# Patient Record
Sex: Male | Born: 1990 | Race: White | Hispanic: No | Marital: Single | Smoking: Never smoker
Health system: Southern US, Community
[De-identification: ages and names within clinical notes are randomized; demographics above are authoritative.]

---

## 2012-07-09 DIAGNOSIS — F411 Generalized anxiety disorder: Secondary | ICD-10-CM | POA: Insufficient documentation

## 2012-07-09 DIAGNOSIS — F909 Attention-deficit hyperactivity disorder, unspecified type: Secondary | ICD-10-CM | POA: Insufficient documentation

## 2017-07-31 ENCOUNTER — Encounter: Payer: Self-pay | Admitting: Family Medicine

## 2017-07-31 ENCOUNTER — Ambulatory Visit: Payer: Managed Care, Other (non HMO) | Admitting: Family Medicine

## 2017-07-31 DIAGNOSIS — S63642A Sprain of metacarpophalangeal joint of left thumb, initial encounter: Secondary | ICD-10-CM | POA: Diagnosis not present

## 2017-07-31 NOTE — Patient Instructions (Signed)
You have skier's thumb (injury to the UCL of the thumb). It's critical you wear the brace at all times. Tylenol and/or ibuprofen as needed for pain. Follow up with me in about 1 1/2 weeks when you're 2 weeks out. We will repeat your x-rays at that time and again discuss conservative versus surgical management further. Most of these heal well without surgery as long as the joint isn't out of place due to a tendon (your x-rays do not indicate you have this). Expect 6-8 weeks for this to heal.

## 2017-08-04 ENCOUNTER — Encounter: Payer: Self-pay | Admitting: Family Medicine

## 2017-08-04 DIAGNOSIS — S63642A Sprain of metacarpophalangeal joint of left thumb, initial encounter: Secondary | ICD-10-CM | POA: Insufficient documentation

## 2017-08-04 NOTE — Assessment & Plan Note (Signed)
Unfortunately unable to pull up radiographs on CD though was able to review on his phone - he has a very small avulsion but no subluxation of 1st MCP joint, consistent with Grade 3 skiers thumb.  We discussed both conservative and operative treatment of this condition.  Will go ahead with conservative treatment without evidence Stener lesion but discussed if not improving over 6-8 weeks or has evidence of this lesion will refer for reconstruction.  Stressed importance of wearing brace at all times.  Tylenol and/or ibuprofen.  F/u in 1 1/2 weeks.

## 2017-08-04 NOTE — Progress Notes (Signed)
PCP: Patient, No Pcp Per  Subjective:   HPI: Patient is a 26 y.o. male here for left thumb injury.  Patient reports on 11/15 he accidentally jammed his left thumb into the counter. Immediate pain ulnar side of left thumb. He is right handed. Wearing thumb spica brace, took ibuprofen a couple times. Pain level 4/10 and throbbing. Associated swelling. No other skin changes, numbness.  History reviewed. No pertinent past medical history.  Current Outpatient Medications on File Prior to Visit  Medication Sig Dispense Refill  . dicyclomine (BENTYL) 10 MG capsule Take 10 mg by mouth.    . hydrOXYzine (ATARAX/VISTARIL) 10 MG tablet Take 10 mg by mouth.    . methylphenidate (RITALIN) 10 MG tablet Take 10 mg by mouth.    Marland Kitchen. omeprazole (PRILOSEC) 20 MG capsule Take by mouth.     No current facility-administered medications on file prior to visit.     History reviewed. No pertinent surgical history.  No Known Allergies  Social History   Socioeconomic History  . Marital status: Single    Spouse name: Not on file  . Number of children: Not on file  . Years of education: Not on file  . Highest education level: Not on file  Social Needs  . Financial resource strain: Not on file  . Food insecurity - worry: Not on file  . Food insecurity - inability: Not on file  . Transportation needs - medical: Not on file  . Transportation needs - non-medical: Not on file  Occupational History  . Not on file  Tobacco Use  . Smoking status: Never Smoker  . Smokeless tobacco: Never Used  Substance and Sexual Activity  . Alcohol use: Not on file  . Drug use: Not on file  . Sexual activity: Not on file  Other Topics Concern  . Not on file  Social History Narrative  . Not on file    History reviewed. No pertinent family history.  BP 128/84   Pulse 76   Ht 6\' 1"  (1.854 m)   Wt 166 lb (75.3 kg)   BMI 21.90 kg/m   Review of Systems: See HPI above.     Objective:  Physical  Exam:  Gen: NAD, comfortable in exam room  Left hand/wrist: Mild swelling into thenar eminence.  No bruising, other deformity. TTP UCL 1st MCP joint.  No other tenderness of wrist or hand. FROM digits and wrist with 5/5 strength. No endpoint on testing 1st MCP UCL.  Other collateral ligaments intact. NVI distally.  Right hand/wrist: FROM without pain.   Assessment & Plan:  1. Left skiers thumb - Unfortunately unable to pull up radiographs on CD though was able to review on his phone - he has a very small avulsion but no subluxation of 1st MCP joint, consistent with Grade 3 skiers thumb.  We discussed both conservative and operative treatment of this condition.  Will go ahead with conservative treatment without evidence Stener lesion but discussed if not improving over 6-8 weeks or has evidence of this lesion will refer for reconstruction.  Stressed importance of wearing brace at all times.  Tylenol and/or ibuprofen.  F/u in 1 1/2 weeks.

## 2017-08-21 ENCOUNTER — Ambulatory Visit: Payer: Managed Care, Other (non HMO) | Admitting: Family Medicine

## 2017-08-21 ENCOUNTER — Encounter: Payer: Self-pay | Admitting: Family Medicine

## 2017-08-21 ENCOUNTER — Ambulatory Visit (HOSPITAL_BASED_OUTPATIENT_CLINIC_OR_DEPARTMENT_OTHER)
Admission: RE | Admit: 2017-08-21 | Discharge: 2017-08-21 | Disposition: A | Discharge status: Home / Self Care | Payer: Managed Care, Other (non HMO) | Source: Ambulatory Visit | Attending: Family Medicine | Admitting: Family Medicine

## 2017-08-21 VITALS — BP 125/79 | HR 80 | Ht 73.0 in | Wt 166.0 lb

## 2017-08-21 DIAGNOSIS — S63642A Sprain of metacarpophalangeal joint of left thumb, initial encounter: Secondary | ICD-10-CM

## 2017-08-21 DIAGNOSIS — S63642D Sprain of metacarpophalangeal joint of left thumb, subsequent encounter: Secondary | ICD-10-CM

## 2017-08-21 DIAGNOSIS — S5332XD Traumatic rupture of left ulnar collateral ligament, subsequent encounter: Secondary | ICD-10-CM | POA: Insufficient documentation

## 2017-08-21 DIAGNOSIS — X58XXXD Exposure to other specified factors, subsequent encounter: Secondary | ICD-10-CM | POA: Insufficient documentation

## 2017-08-21 NOTE — Patient Instructions (Signed)
Wear the brace for 2 more weeks as you have been. After this, wear only if needed and start occupational therapy at [facility]. Tylenol, ibuprofen only if needed. Follow up with me in 1 month for reevaluation.

## 2017-08-22 NOTE — Assessment & Plan Note (Signed)
Improving clinically.  Thumb spica brace for 2 more weeks for total of 6 weeks immobilization then plan to start occupational therapy.  Tylenol, ibuprofen as needed.  F/u in 1 month.

## 2017-08-22 NOTE — Progress Notes (Signed)
PCP: Patient, No Pcp Per  Subjective:   HPI: Patient is a 26 y.o. male here for left thumb injury.  11/21: Patient reports on 11/15 he accidentally jammed his left thumb into the counter. Immediate pain ulnar side of left thumb. He is right handed. Wearing thumb spica brace, took ibuprofen a couple times. Pain level 4/10 and throbbing. Associated swelling. No other skin changes, numbness.  12/12: Patient reports he's doing well. A little discomfort but improved. Has been wearing thumb spica brace regularly. No skin changes, numbness.  History reviewed. No pertinent past medical history.  Current Outpatient Medications on File Prior to Visit  Medication Sig Dispense Refill  . dicyclomine (BENTYL) 10 MG capsule Take 10 mg by mouth.    . hydrOXYzine (ATARAX/VISTARIL) 10 MG tablet Take 10 mg by mouth.    . methylphenidate (RITALIN) 10 MG tablet Take 10 mg by mouth.    Marland Kitchen. omeprazole (PRILOSEC) 20 MG capsule Take by mouth.     No current facility-administered medications on file prior to visit.     History reviewed. No pertinent surgical history.  No Known Allergies  Social History   Socioeconomic History  . Marital status: Single    Spouse name: Not on file  . Number of children: Not on file  . Years of education: Not on file  . Highest education level: Not on file  Social Needs  . Financial resource strain: Not on file  . Food insecurity - worry: Not on file  . Food insecurity - inability: Not on file  . Transportation needs - medical: Not on file  . Transportation needs - non-medical: Not on file  Occupational History  . Not on file  Tobacco Use  . Smoking status: Never Smoker  . Smokeless tobacco: Never Used  Substance and Sexual Activity  . Alcohol use: Not on file  . Drug use: Not on file  . Sexual activity: Not on file  Other Topics Concern  . Not on file  Social History Narrative  . Not on file    History reviewed. No pertinent family history.  BP  125/79   Pulse 80   Ht 6\' 1"  (1.854 m)   Wt 166 lb (75.3 kg)   BMI 21.90 kg/m   Review of Systems: See HPI above.     Objective:  Physical Exam:  Gen: NAD, comfortable in exam room.  Left hand/wrist: No swelling, bruising, other deformity. Minimal TTP UCL 1st MCP joint.  No other tenderness of wrist or hand. FROM digits and wrist with 5/5 strength but 4/5 with grip strength. Endpoint noted now 1st MCP UCL with laxity.  RCL intact. NVI distally.   Assessment & Plan:  1. Left skiers thumb - Improving clinically.  Thumb spica brace for 2 more weeks for total of 6 weeks immobilization then plan to start occupational therapy.  Tylenol, ibuprofen as needed.  F/u in 1 month.

## 2018-05-05 ENCOUNTER — Other Ambulatory Visit: Payer: Self-pay

## 2018-05-05 ENCOUNTER — Encounter (HOSPITAL_BASED_OUTPATIENT_CLINIC_OR_DEPARTMENT_OTHER): Payer: Self-pay | Admitting: *Deleted

## 2018-05-05 ENCOUNTER — Emergency Department (HOSPITAL_BASED_OUTPATIENT_CLINIC_OR_DEPARTMENT_OTHER)
Admission: EM | Admit: 2018-05-05 | Discharge: 2018-05-05 | Disposition: A | Discharge status: Home / Self Care | Payer: Managed Care, Other (non HMO) | Attending: Emergency Medicine | Admitting: Emergency Medicine

## 2018-05-05 ENCOUNTER — Emergency Department (HOSPITAL_BASED_OUTPATIENT_CLINIC_OR_DEPARTMENT_OTHER): Payer: Managed Care, Other (non HMO)

## 2018-05-05 DIAGNOSIS — Z79899 Other long term (current) drug therapy: Secondary | ICD-10-CM | POA: Diagnosis not present

## 2018-05-05 DIAGNOSIS — K529 Noninfective gastroenteritis and colitis, unspecified: Secondary | ICD-10-CM

## 2018-05-05 DIAGNOSIS — R1031 Right lower quadrant pain: Secondary | ICD-10-CM | POA: Diagnosis present

## 2018-05-05 LAB — COMPREHENSIVE METABOLIC PANEL
ALT: 13 U/L (ref 0–44)
ANION GAP: 11 (ref 5–15)
AST: 17 U/L (ref 15–41)
Albumin: 4.2 g/dL (ref 3.5–5.0)
Alkaline Phosphatase: 42 U/L (ref 38–126)
BUN: 15 mg/dL (ref 6–20)
CALCIUM: 8.8 mg/dL — AB (ref 8.9–10.3)
CO2: 26 mmol/L (ref 22–32)
Chloride: 97 mmol/L — ABNORMAL LOW (ref 98–111)
Creatinine, Ser: 1.09 mg/dL (ref 0.61–1.24)
Glucose, Bld: 94 mg/dL (ref 70–99)
Potassium: 4.2 mmol/L (ref 3.5–5.1)
Sodium: 134 mmol/L — ABNORMAL LOW (ref 135–145)
Total Bilirubin: 0.6 mg/dL (ref 0.3–1.2)
Total Protein: 7.3 g/dL (ref 6.5–8.1)

## 2018-05-05 LAB — CBC
HCT: 43.5 % (ref 39.0–52.0)
HEMOGLOBIN: 15.6 g/dL (ref 13.0–17.0)
MCH: 31 pg (ref 26.0–34.0)
MCHC: 35.9 g/dL (ref 30.0–36.0)
MCV: 86.3 fL (ref 78.0–100.0)
PLATELETS: 148 10*3/uL — AB (ref 150–400)
RBC: 5.04 MIL/uL (ref 4.22–5.81)
RDW: 12.1 % (ref 11.5–15.5)
WBC: 4.1 10*3/uL (ref 4.0–10.5)

## 2018-05-05 LAB — URINALYSIS, ROUTINE W REFLEX MICROSCOPIC
Bilirubin Urine: NEGATIVE
Glucose, UA: NEGATIVE mg/dL
HGB URINE DIPSTICK: NEGATIVE
Ketones, ur: NEGATIVE mg/dL
LEUKOCYTES UA: NEGATIVE
Nitrite: NEGATIVE
PROTEIN: NEGATIVE mg/dL
SPECIFIC GRAVITY, URINE: 1.015 (ref 1.005–1.030)
pH: 6 (ref 5.0–8.0)

## 2018-05-05 LAB — LIPASE, BLOOD: LIPASE: 22 U/L (ref 11–51)

## 2018-05-05 MED ORDER — IOPAMIDOL (ISOVUE-300) INJECTION 61%
100.0000 mL | Freq: Once | INTRAVENOUS | Status: AC | PRN
  Administered 2018-05-05: 100 mL via INTRAVENOUS

## 2018-05-05 MED ORDER — ONDANSETRON 8 MG PO TBDP: 8.0000 mg | ORAL_TABLET | Freq: Three times a day (TID) | ORAL | 0 refills | Status: AC | PRN

## 2018-05-05 NOTE — ED Triage Notes (Signed)
Diarrhea and vomiting x 2 days. Fever 2 days. Abdominal pain. Tenderness right lower quadrant.

## 2018-05-05 NOTE — Discharge Instructions (Addendum)
Your symptoms are most likely caused by a viral infection.  Continue to drink plenty of fluids, take Zofran as prescribed as needed for nausea and vomiting.  If symptoms persist and do not improve, please follow-up with gastroenterologist.  If however you develop more pain in the right lower quadrant, continued to have fever, and worsening symptoms, return to emergency department for reevaluation for possible appendicitis.

## 2018-05-05 NOTE — ED Notes (Signed)
Oral contrast given to pt at this time by CT tech.

## 2018-05-05 NOTE — ED Provider Notes (Signed)
[facility] EMERGENCY DEPARTMENT Provider Note   CSN: [id number] Arrival date & time: 05/05/18  1130     History   Chief Complaint Chief Complaint  Patient presents with  . Abdominal Pain    HPI Steve Allison is a 27 y.o. male.  HPI Steve Allison is a 27 y.o. male history of IBS and ADD, presents to emergency department complaining of abdominal pain nausea, vomiting, diarrhea.  He states symptoms started 4 days ago.  Initially started with nausea vomiting diarrhea.  He states those all subsided 2 days ago.  However he has had fever persistently for 3 days and now increased pain in his abdomen.  He states his last bowel movement was 2 days ago and was diarrhea.  He has not had a bowel movement since then.  He states pain is mainly in the right lower quadrant.  It is worse when palpating his abdomen.  It is not worse with movement.  He states his fever has been as high as 104 yesterday.  He had a fever of 102 this morning and took ibuprofen prior to coming in.  Patient went to urgent care and was sent here for further evaluation.  Patient denies any prior abdominal surgeries.  He denies any back pain.  No urinary symptoms.  He denies any nausea and vomiting at this time.  History reviewed. No pertinent past medical history.  Patient Active Problem List   Diagnosis Date Noted  . Skier's thumb, left, initial encounter 08/04/2017  . Anxiety state 07/09/2012  . Attention deficit hyperactivity disorder (ADHD) 07/09/2012    History reviewed. No pertinent surgical history.      Home Medications    Prior to Admission medications   Medication Sig Start Date End Date Taking? Authorizing Provider  dicyclomine (BENTYL) 10 MG capsule Take 10 mg by mouth. 03/11/17  Yes [provider]  hydrOXYzine (ATARAX/VISTARIL) 10 MG tablet Take 10 mg by mouth. 07/10/17  Yes [provider]  methylphenidate (RITALIN) 10 MG tablet Take 10 mg by mouth. 07/10/17  Yes [provider]  omeprazole (PRILOSEC) 20 MG capsule Take by mouth. 03/17/17  Yes [provider]    Family History No family history on file.  Social History Social History   Tobacco Use  . Smoking status: Never Smoker  . Smokeless tobacco: Never Used  Substance Use Topics  . Alcohol use: Not on file  . Drug use: Not on file     Allergies   Patient has no known allergies.   Review of Systems Review of Systems  Constitutional: Positive for chills and fever.  Respiratory: Negative for cough, chest tightness and shortness of breath.   Cardiovascular: Negative for chest pain, palpitations and leg swelling.  Gastrointestinal: Positive for abdominal pain, diarrhea, nausea and vomiting. Negative for abdominal distention.  Genitourinary: Negative for dysuria, frequency, hematuria and urgency.  Musculoskeletal: Negative for arthralgias, myalgias, neck pain and neck stiffness.  Skin: Negative for rash.  Allergic/Immunologic: Negative for immunocompromised state.  Neurological: Negative for dizziness, weakness, light-headedness, numbness and headaches.     Physical Exam Updated Vital Signs BP 131/87 (BP Location: Right Arm)   Pulse 70   Temp 98.9 F (37.2 C) (Oral)   Resp 18   Ht 6\' 1"  (1.854 m)   Wt 73 kg   SpO2 100%   BMI 21.24 kg/m   Physical Exam  Constitutional: He appears well-developed and well-nourished. No distress.  HENT:  Head: Normocephalic and atraumatic.  Eyes: Conjunctivae  are normal.  Neck: Neck supple.  Cardiovascular: Normal rate, regular rhythm and normal heart sounds.  Pulmonary/Chest: Effort normal. No respiratory distress. He has no wheezes. He has no rales.  Abdominal: Soft. Bowel sounds are normal. He exhibits no distension. There is tenderness. There is no rebound.  Right lower quadrant tenderness with mild guarding  Musculoskeletal: He exhibits no edema.  Neurological: He is alert.  Skin: Skin is warm and dry.  Nursing note and  vitals reviewed.    ED Treatments / Results  Labs (all labs ordered are listed, but only abnormal results are displayed) Labs Reviewed  COMPREHENSIVE METABOLIC PANEL - Abnormal; Notable for the following components:      Result Value   Sodium 134 (*)    Chloride 97 (*)    Calcium 8.8 (*)    All other components within normal limits  CBC - Abnormal; Notable for the following components:   Platelets 148 (*)    All other components within normal limits  LIPASE, BLOOD  URINALYSIS, ROUTINE W REFLEX MICROSCOPIC    EKG None  Radiology Ct Abdomen Pelvis W Contrast  Result Date: 05/05/2018 CLINICAL DATA:  Lower abdominal pain more tender in RIGHT lower quadrant, fever, vomiting, diarrhea, constipation since Saturday but diarrhea again today with bowel movement, history irritable bowel syndrome, GERD EXAM: CT ABDOMEN AND PELVIS WITH CONTRAST TECHNIQUE: Multidetector CT imaging of the abdomen and pelvis was performed using the standard protocol following bolus administration of intravenous contrast. Sagittal and coronal MPR images reconstructed from axial data set. CONTRAST:  ISOVUE-300 IOPAMIDOL (ISOVUE-300) INJECTION 61% COMPARISON:  None FINDINGS: Lower chest: Lung bases clear Hepatobiliary: Gallbladder and liver normal appearance Pancreas: Pancreas normal appearance Spleen: Normal appearance Adrenals/Urinary Tract: Adrenal glands, kidneys, ureters, and bladder normal appearance Stomach/Bowel: Appendix coiled adjacent to cecal tip. Appendix measures 7 mm diameter without definite surrounding inflammatory changes. Bowel wall thickening of distal ileum compatible with enteritis. Associated hyperemia of the mesentery associated with the thickened loops. Stomach and remaining bowel loops unremarkable. No evidence of bowel obstruction. Vascular/Lymphatic: Numerous normal to upper normal sized mesenteric lymph nodes in mid abdomen and RIGHT mid abdomen. Enlarged 11 mm short axis node medial to  ileocecal valve image 40. Vascular structures unremarkable. Reproductive: Unremarkable Other: No free air or free fluid.  No hernia. Musculoskeletal: Unremarkable IMPRESSION: Bowel wall thickening with associated hyperemia of the mesentery involving terminal ileum consistent with enteritis; differential diagnosis would include infection and inflammatory bowel disease. Numerous predominantly normal to upper normal sized mesenteric lymph nodes though a single minimally enlarged lymph node is seen in the mesentery in the RIGHT mid abdomen. Appendix is minimally prominent size at 7 mm diameter though no definite periappendiceal infiltrative changes are seen; recommend clinical correlation and if patient has persistent or worsening symptoms, consider repeat imaging to exclude appendicitis. Electronically Signed   By: Ulyses Southward M.D.   On: 05/05/2018 17:32    Procedures Procedures (including critical care time)  Medications Ordered in ED Medications - No data to display   Initial Impression / Assessment and Plan / ED Course  I have reviewed the triage vital signs and the nursing notes.  Pertinent labs & imaging results that were available during my care of the patient were reviewed by me and considered in my medical decision making (see chart for details).     Patient in emergency department with fever up to 104 for 3 days, right lower quadrant pain, and tenderness with some guarding on the exam.  Initially had nausea, vomiting, diarrhea which now all improved.  He is afebrile here, normal vital signs.  Labs obtained, pending.  Will get CT abdomen pelvis to rule out appendicitis.]  6:09 PM Patient CT scan is as described above.  It showed some enteritis as well as some prominence of appendix.  I discussed symptoms with our on-call general surgeon, Dr. Donell BeersByerly who recommended discharge home with return to the ER if symptoms are not improving or worsening.  Patient has no other findings to suggest an  appendicitis on CT scan.  Symptoms are most likely due to his enteritis.  Patient is afebrile here, nontoxic-appearing.  Discussed results and plan with him will discharge home.  He requested referral to GI if his symptoms do not improve.  We will have him follow-up closely with gastroenterology if symptoms continue or if worsening go to the ER. Fluids and antipyretics at home.   Vitals:   05/05/18 1143 05/05/18 1321 05/05/18 1523 05/05/18 1724  BP: 129/88 131/87 (!) 136/92 (!) 141/94  Pulse: 77 70 65 73  Resp: 18 18 16 16   Temp: 98.9 F (37.2 C)   99.1 F (37.3 C)  TempSrc: Oral   Oral  SpO2: 99% 100% 99% 99%  Weight:      Height:         Final Clinical Impressions(s) / ED Diagnoses   Final diagnoses:  Enteritis    ED Discharge Orders    None       Jaynie CrumbleKirichenko, Maciah Feeback, PA-C 05/05/18 2304    Vanetta MuldersZackowski, Scott, MD 05/11/18 [phone]

## 2018-05-05 NOTE — ED Notes (Signed)
ED Provider at bedside. 

## 2020-01-14 IMAGING — CT CT ABD-PELV W/ CM
2 of 4 series · 15 of 46 positions shown, 17 images · IV contrast (APPLIED)
Comparison: None

CLINICAL DATA: Lower abdominal pain more tender in RIGHT lower
quadrant, fever, vomiting, diarrhea, constipation since [REDACTED] but
diarrhea again today with bowel movement, history irritable bowel
syndrome, GERD

EXAM:
CT ABDOMEN AND PELVIS WITH CONTRAST
TECHNIQUE: Multidetector CT imaging of the abdomen and pelvis was performed
using the standard protocol following bolus administration of
intravenous contrast. Sagittal and coronal MPR images reconstructed
from axial data set.
CONTRAST:  100mL [id number] IOPAMIDOL (UONGPW-966) INJECTION 61%

[Series 2: axial st · axial · 0.72mm/px · z∈[+480,+940]mm · 12 of 106 slices shown, 14 images]
[im 9/106  soft-tissue]
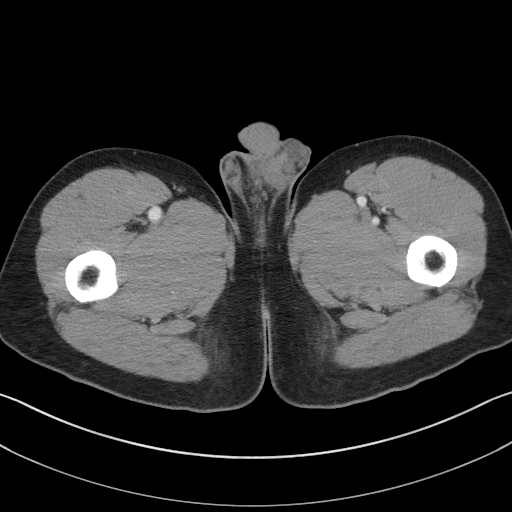
[im 9/106  bone]
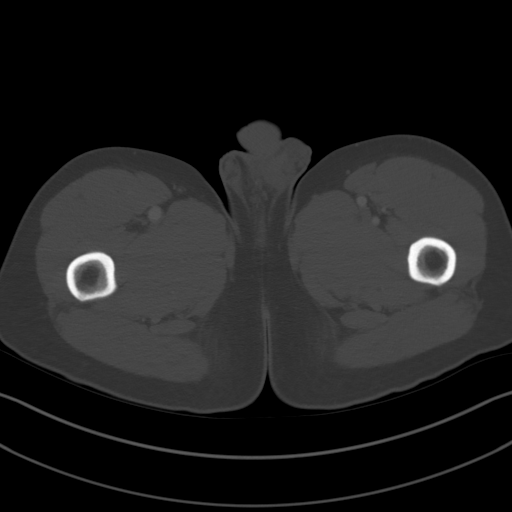
[im 17/106  soft-tissue]
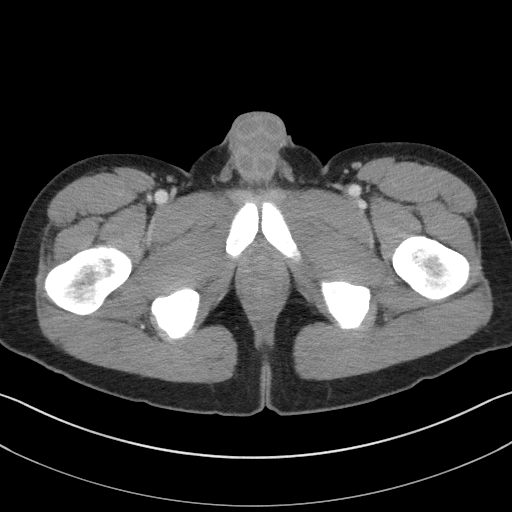
[im 26/106  soft-tissue]
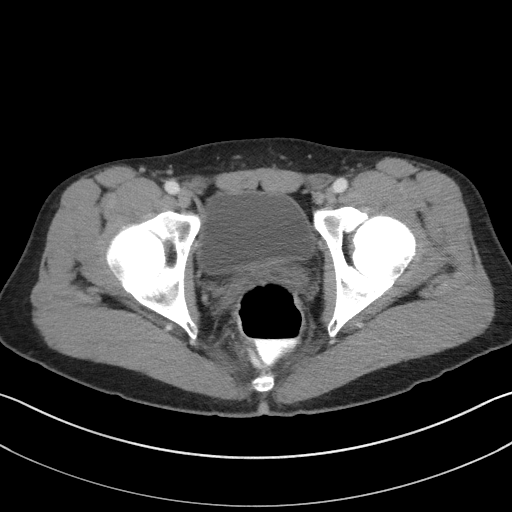
[im 34/106  soft-tissue]
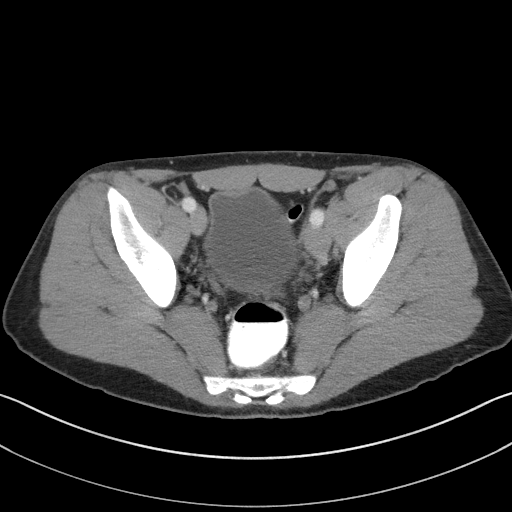
[im 43/106  soft-tissue]
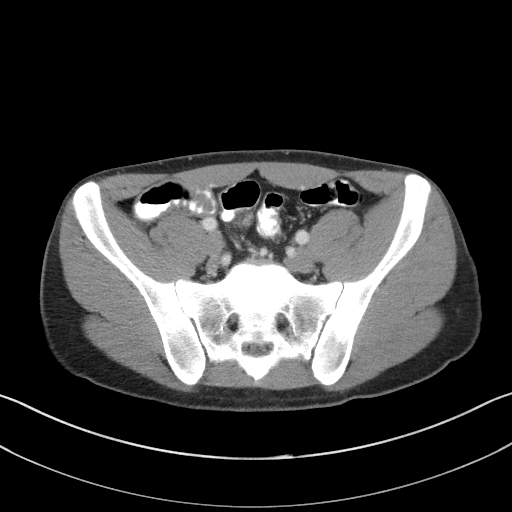
[im 51/106  soft-tissue]
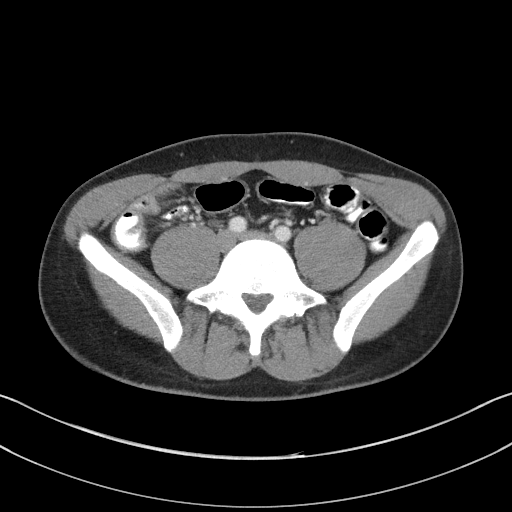
[im 59/106  soft-tissue]
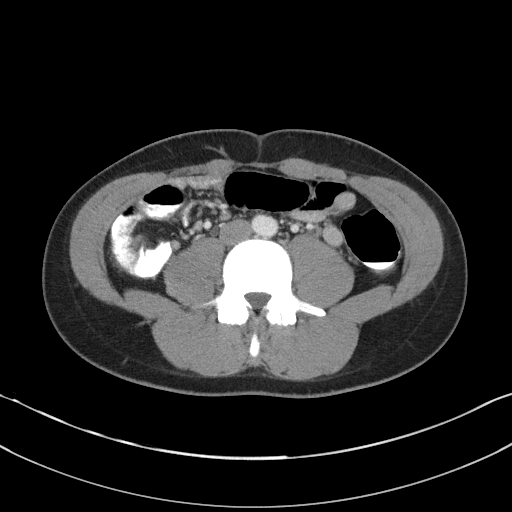
[im 68/106  soft-tissue]
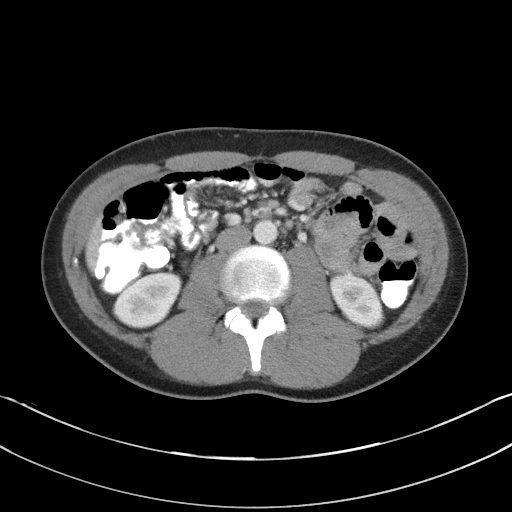
[im 76/106  soft-tissue]
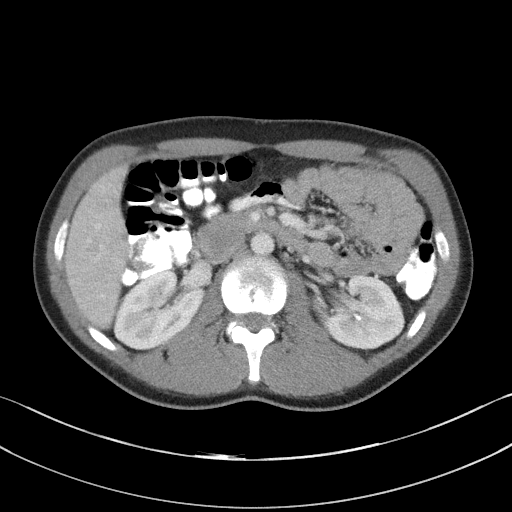
[im 76/106  bone]
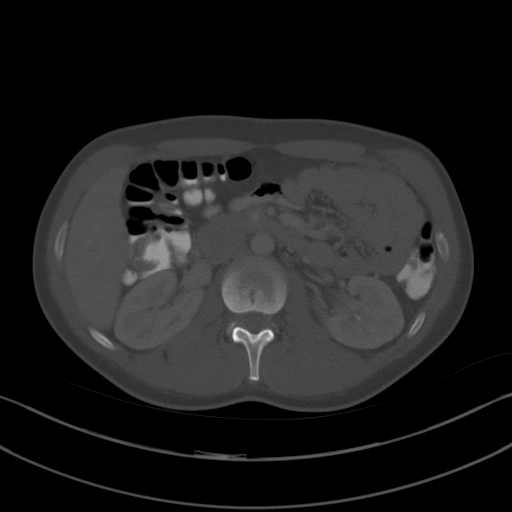
[im 85/106  soft-tissue]
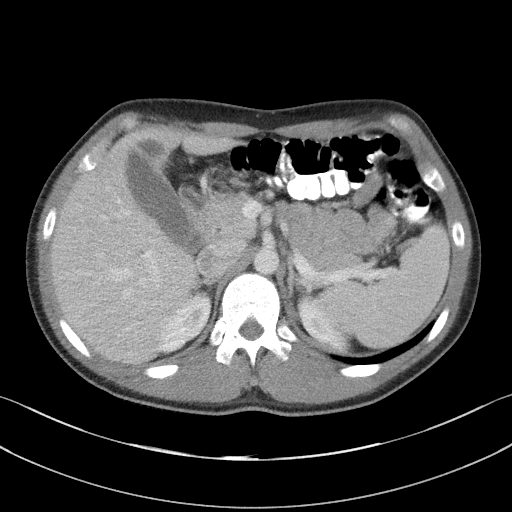
[im 93/106  soft-tissue]
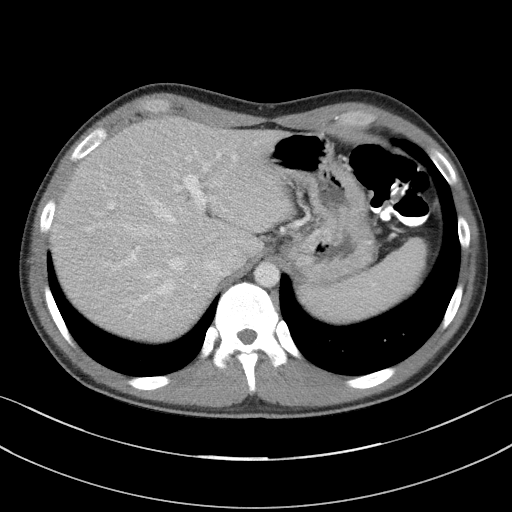
[im 101/106  soft-tissue]
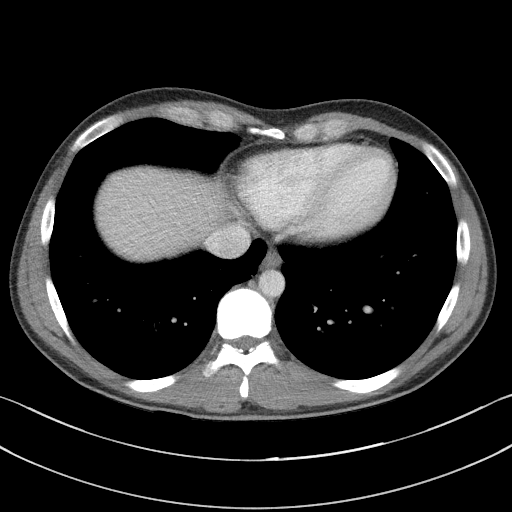

[Series 5: coronal st · coronal · 0.67mm/px · 3 of 79 slices shown]
[im 27/79  soft-tissue]
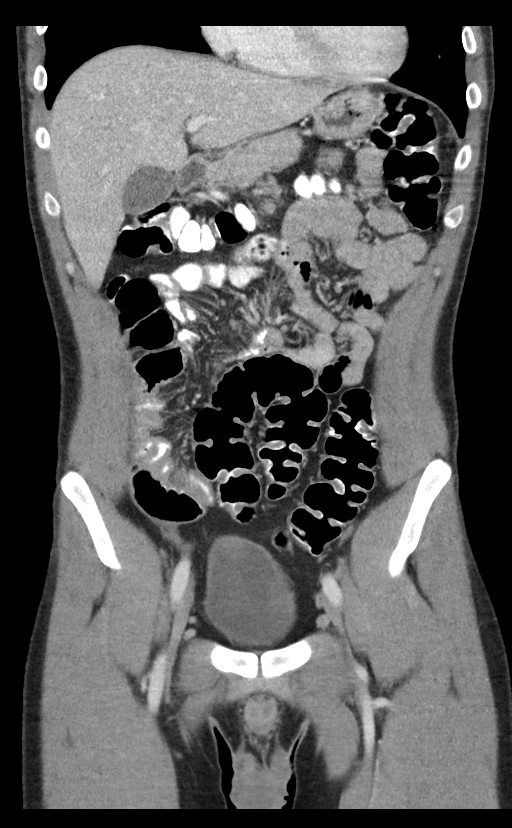
[im 35/79  soft-tissue]
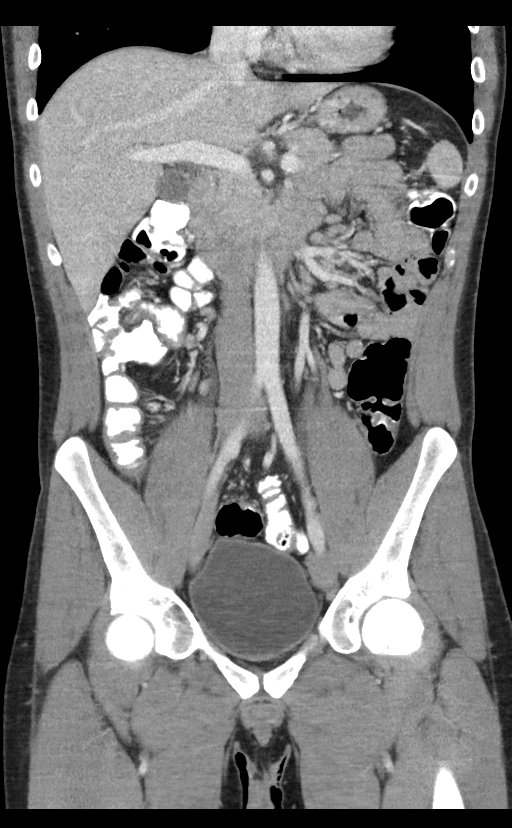
[im 44/79  soft-tissue]
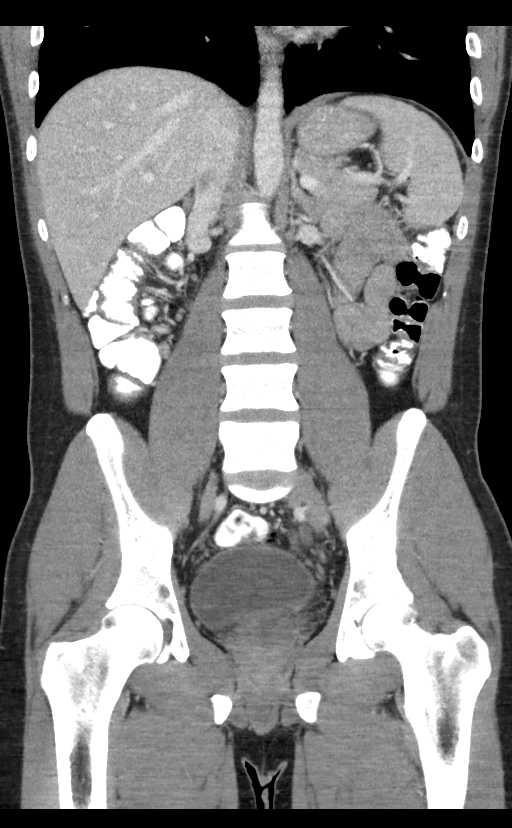

[15 of 46 positions shown; findings below may reference images not displayed]

FINDINGS: Lower chest: Lung bases clear

Hepatobiliary: Gallbladder and liver normal appearance

Pancreas: Pancreas normal appearance

Spleen: Normal appearance

Adrenals/Urinary Tract: Adrenal glands, kidneys, ureters, and
bladder normal appearance

Stomach/Bowel: Appendix coiled adjacent to cecal tip. Appendix
measures 7 mm diameter without definite surrounding inflammatory
changes.. Bowel wall thickening of distal ileum compatible with
enteritis. Associated hyperemia of the mesentery associated with the
thickened loops. Stomach and remaining bowel loops unremarkable. No
evidence of bowel obstruction.

Vascular/Lymphatic: Numerous normal to upper normal sized mesenteric
lymph nodes in mid abdomen and RIGHT mid abdomen. Enlarged 11 mm
short axis node medial to ileocecal valve image 40. Vascular
structures unremarkable.

Reproductive: Unremarkable

Other: No free air or free fluid.  No hernia.

Musculoskeletal: Unremarkable
IMPRESSION: Bowel wall thickening with associated hyperemia of the mesentery
involving terminal ileum consistent with enteritis; differential
diagnosis would include infection and inflammatory bowel disease.

Numerous predominantly normal to upper normal sized mesenteric lymph
nodes though a single minimally enlarged lymph node is seen in the
mesentery in the RIGHT mid abdomen.

Appendix is minimally prominent size at 7 mm diameter though no
definite periappendiceal infiltrative changes are seen; recommend
clinical correlation and if patient has persistent or worsening
symptoms, consider repeat imaging to exclude appendicitis.
